# Patient Record
Sex: Female | Born: 2000 | Race: White | Hispanic: No | Marital: Single | Smoking: Never smoker
Health system: Southern US, Community
[De-identification: ages and names within clinical notes are randomized; demographics above are authoritative.]

## PROBLEM LIST (undated history)

## (undated) DIAGNOSIS — F419 Anxiety disorder, unspecified: Secondary | ICD-10-CM

## (undated) HISTORY — DX: Anxiety disorder, unspecified: F41.9

---

## 2019-02-08 ENCOUNTER — Other Ambulatory Visit: Payer: Self-pay

## 2019-02-08 DIAGNOSIS — Z20822 Contact with and (suspected) exposure to covid-19: Secondary | ICD-10-CM

## 2019-02-08 NOTE — Progress Notes (Unsigned)
lab

## 2019-02-09 LAB — NOVEL CORONAVIRUS, NAA: SARS-CoV-2, NAA: NOT DETECTED

## 2019-02-10 ENCOUNTER — Other Ambulatory Visit: Payer: Self-pay | Admitting: *Deleted

## 2019-02-10 DIAGNOSIS — Z20822 Contact with and (suspected) exposure to covid-19: Secondary | ICD-10-CM

## 2019-02-11 LAB — NOVEL CORONAVIRUS, NAA: SARS-CoV-2, NAA: NOT DETECTED

## 2019-02-14 ENCOUNTER — Other Ambulatory Visit: Payer: Self-pay

## 2019-02-14 DIAGNOSIS — Z20822 Contact with and (suspected) exposure to covid-19: Secondary | ICD-10-CM

## 2019-02-15 LAB — NOVEL CORONAVIRUS, NAA: SARS-CoV-2, NAA: NOT DETECTED

## 2019-02-24 ENCOUNTER — Other Ambulatory Visit: Payer: Self-pay

## 2019-02-24 DIAGNOSIS — Z20822 Contact with and (suspected) exposure to covid-19: Secondary | ICD-10-CM

## 2019-02-25 LAB — NOVEL CORONAVIRUS, NAA: SARS-CoV-2, NAA: NOT DETECTED

## 2019-03-14 ENCOUNTER — Other Ambulatory Visit: Payer: Self-pay

## 2019-03-14 DIAGNOSIS — Z20822 Contact with and (suspected) exposure to covid-19: Secondary | ICD-10-CM

## 2019-03-15 LAB — NOVEL CORONAVIRUS, NAA: SARS-CoV-2, NAA: NOT DETECTED

## 2019-04-05 ENCOUNTER — Other Ambulatory Visit: Payer: Self-pay

## 2019-04-05 DIAGNOSIS — Z20822 Contact with and (suspected) exposure to covid-19: Secondary | ICD-10-CM

## 2019-04-07 LAB — NOVEL CORONAVIRUS, NAA: SARS-CoV-2, NAA: DETECTED — AB

## 2020-01-22 ENCOUNTER — Emergency Department: Payer: 59

## 2020-01-22 ENCOUNTER — Other Ambulatory Visit: Payer: Self-pay

## 2020-01-22 DIAGNOSIS — R0602 Shortness of breath: Secondary | ICD-10-CM | POA: Diagnosis present

## 2020-01-22 DIAGNOSIS — Z5321 Procedure and treatment not carried out due to patient leaving prior to being seen by health care provider: Secondary | ICD-10-CM | POA: Diagnosis not present

## 2020-01-22 DIAGNOSIS — F410 Panic disorder [episodic paroxysmal anxiety] without agoraphobia: Secondary | ICD-10-CM | POA: Insufficient documentation

## 2020-01-22 NOTE — ED Triage Notes (Addendum)
Patient reports it is hard to breath at night while sleeping and she thinks she is having anxiety because of that and wants to make sure she is okay.  States she had started smoking again and feeling anxious that she has harmed herself.

## 2020-01-23 ENCOUNTER — Encounter: Payer: Self-pay | Admitting: Radiology

## 2020-01-23 ENCOUNTER — Other Ambulatory Visit: Payer: Self-pay

## 2020-01-23 ENCOUNTER — Emergency Department
Admission: EM | Admit: 2020-01-23 | Discharge: 2020-01-23 | Disposition: A | Discharge status: Home / Self Care | Payer: 59 | Attending: Emergency Medicine | Admitting: Emergency Medicine

## 2020-01-23 NOTE — ED Notes (Signed)
Pt states she does not want to stay for her results at this time. Will follow up with [facility]. No distress noted at this time.

## 2020-01-24 ENCOUNTER — Ambulatory Visit: Payer: 59 | Admitting: Hospice and Palliative Medicine

## 2020-01-24 ENCOUNTER — Other Ambulatory Visit: Payer: Self-pay

## 2020-01-24 ENCOUNTER — Encounter: Payer: Self-pay | Admitting: Hospice and Palliative Medicine

## 2020-01-24 DIAGNOSIS — R079 Chest pain, unspecified: Secondary | ICD-10-CM | POA: Diagnosis not present

## 2020-01-24 DIAGNOSIS — Z7689 Persons encountering health services in other specified circumstances: Secondary | ICD-10-CM

## 2020-01-24 DIAGNOSIS — F410 Panic disorder [episodic paroxysmal anxiety] without agoraphobia: Secondary | ICD-10-CM | POA: Diagnosis not present

## 2020-01-24 DIAGNOSIS — F411 Generalized anxiety disorder: Secondary | ICD-10-CM

## 2020-01-24 MED ORDER — ESCITALOPRAM OXALATE 10 MG PO TABS: 10.0000 mg | ORAL_TABLET | Freq: Every day | ORAL | 0 refills | Status: DC

## 2020-01-24 MED ORDER — LORAZEPAM 0.5 MG PO TABS: 0.5000 mg | ORAL_TABLET | Freq: Two times a day (BID) | ORAL | 0 refills | Status: AC | PRN

## 2020-01-24 NOTE — Progress Notes (Signed)
[facility] [address] [place], [place] [address]  Internal MEDICINE  Office Visit Note  Patient Name: Connie Rich  [id number]  [id number]  Date of Service: 01/24/2020   Complaints/HPI Pt is here for establishment of PCP. Chief Complaint  Patient presents with  . New Patient (Initial Visit)    past week since pt started school, pt has felt anxity, not sleeping well, doesnt feel like herself, tense, hyperventilating  . Quality Metric Gaps    HepV, HIV screening, TDAP   HPI  New patient today wanting to establish care with provider in [place]. 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 She was seen by her PCP in July of this year in [place]. Since the end of August she has been having episodes of overwhelming anxiety as well as panic attacks In the last week she says she has had 3-4 episodes of panic attacks. She describes theses episodes as periodic, not related to one certain thing. She has been in class, out to dinner or in her apartment when these episodes have happened. She says she gets this overwhelming sense of anxiety and stress and then goes into a panic state. During that time she says she feels chest pressure, heart racing and she hyperventilates. She had an episode 9/7 and her symptoms prompted her to be checked out in the emergency department. CXR negative at that time, no further diagnostics or treatments started. She denies any recent traumatic experience or change in her lifestyle, no recent alcohol or illicit drug use. No recent changes in her daily medications. She continues to sleep well at night and exercises daily.  Current Medication: Outpatient Encounter Medications as of 01/24/2020  Medication Sig  . levonorgestrel-ethinyl estradiol (LARISSIA) 0.1-20 MG-MCG tablet Take 1 tablet by mouth daily.  Marland Kitchen escitalopram (LEXAPRO) 10 MG tablet Take 1 tablet (10 mg total) by mouth daily.  Marland Kitchen LORazepam (ATIVAN)  0.5 MG tablet Take 1 tablet (0.5 mg total) by mouth 2 (two) times daily as needed for anxiety.   No facility-administered encounter medications on file as of 01/24/2020.    Surgical History: History reviewed. No pertinent surgical history.  Medical History: Past Medical History:  Diagnosis Date  . Anxiety     Family History: Family History  Problem Relation Age of Onset  . Crohn's disease Mother   . Anxiety disorder Mother   . Crohn's disease Maternal Aunt   . Anxiety disorder Maternal Aunt   . Anxiety disorder Maternal Uncle     Social History   Socioeconomic History  . Marital status: Single    Spouse name: Not on file  . Number of children: Not on file  . Years of education: Not on file  . Highest education level: Not on file  Occupational History  . Not on file  Tobacco Use  . Smoking status: Never Smoker  . Smokeless tobacco: Never Used  Substance and Sexual Activity  . Alcohol use: Not Currently    Comment: socially  . Drug use: Never  . Sexual activity: Not on file  Other Topics Concern  . Not on file  Social History Narrative  . Not on file   Social Determinants of Health   Financial Resource Strain:   . Difficulty of Paying Living Expenses: Not on file  Food Insecurity:   . Worried About Programme researcher, broadcasting/film/video in the Last Year: Not on file  . Ran Out of Food in the Last Year:  Not on file  Transportation Needs:   . Lack of Transportation (Medical): Not on file  . Lack of Transportation (Non-Medical): Not on file  Physical Activity:   . Days of Exercise per Week: Not on file  . Minutes of Exercise per Session: Not on file  Stress:   . Feeling of Stress : Not on file  Social Connections:   . Frequency of Communication with Friends and Family: Not on file  . Frequency of Social Gatherings with Friends and Family: Not on file  . Attends Religious Services: Not on file  . Active Member of Clubs or Organizations: Not on file  . Attends Tax inspector Meetings: Not on file  . Marital Status: Not on file  Intimate Partner Violence:   . Fear of Current or Ex-Partner: Not on file  . Emotionally Abused: Not on file  . Physically Abused: Not on file  . Sexually Abused: Not on file     Review of Systems  Constitutional: Negative for activity change, appetite change, chills, diaphoresis, fatigue and unexpected weight change.  HENT: Negative for ear pain, postnasal drip and sinus pressure.   Eyes: Negative for photophobia, discharge, redness, itching and visual disturbance.  Respiratory: Negative for cough, shortness of breath and wheezing.   Cardiovascular: Negative for chest pain, palpitations and leg swelling.       During panic attacks she feels her heart racing and chest pressure  Gastrointestinal: Negative for abdominal pain, constipation, diarrhea, nausea and vomiting.  Genitourinary: Negative for dysuria and flank pain.  Musculoskeletal: Negative for arthralgias, back pain, gait problem and neck pain.  Skin: Negative for color change.  Allergic/Immunologic: Negative for environmental allergies and food allergies.  Neurological: Negative for dizziness and headaches.  Hematological: Does not bruise/bleed easily.  Psychiatric/Behavioral: Negative for agitation, behavioral problems (depression) and hallucinations. The patient is nervous/anxious.        "panic attacks"    Vital Signs: BP 119/77   Pulse 92   Temp (!) 97.4 F (36.3 C)   Wt 169 lb (76.7 kg)   LMP 01/18/2020 (Exact Date)   SpO2 97%   BMI 25.89 kg/m    Physical Exam Constitutional:      Appearance: Normal appearance. She is normal weight.  HENT:     Nose: Nose normal.     Mouth/Throat:     Mouth: Mucous membranes are moist.  Cardiovascular:     Rate and Rhythm: Normal rate and regular rhythm.     Pulses: Normal pulses.     Heart sounds: Normal heart sounds.  Pulmonary:     Effort: Pulmonary effort is normal.     Breath sounds: Normal breath  sounds.  Abdominal:     General: Abdomen is flat.     Palpations: Abdomen is soft.  Musculoskeletal:        General: Normal range of motion.     Cervical back: Normal range of motion.  Skin:    General: Skin is warm.  Neurological:     General: No focal deficit present.     Mental Status: She is alert and oriented to person, place, and time. Mental status is at baseline.  Psychiatric:        Mood and Affect: Mood normal.        Behavior: Behavior normal.        Thought Content: Thought content normal.    Assessment/Plan: 1. Encounter to establish care with new doctor Requested that she have reports including any lab  testing she had done by her PCP in [place]. May need to order thyroid panel if it was not collected previously to review due to her symptoms.  2. Generalized anxiety disorder with panic attacks Will start low dose Lexapro, discussed it will take this medication 4-6 weeks to take full effect. In the meantime, 30 lorazepam 0.5 mg given for her to use as needed when she experiences a panic attack. Discussed that lorazepam should not be taken on a daily basis. Advised that she will also need to see out therapy or counseling to help with GAD management. Name of local therapist office provided. - LORazepam (ATIVAN) 0.5 MG tablet; Take 1 tablet (0.5 mg total) by mouth 2 (two) times daily as needed for anxiety.  Dispense: 30 tablet; Refill: 0 - escitalopram (LEXAPRO) 10 MG tablet; Take 1 tablet (10 mg total) by mouth daily.  Dispense: 90 tablet; Refill: 0  3. Chest pain, unspecified type CXR completed in ED 9/6, normal. EKG obtained today NSR, shortened PR interval at 104 msec. At this time symptoms do not appear to be cardiac in etiology. If symptoms persist after controlling her anxiety will need further cardiac work-up//possible holter monitor.( might want to look into MVP)  - EKG 12-Lead  General Counseling: [name] understanding of the findings of todays visit  and agrees with plan of treatment. I have discussed any further diagnostic evaluation that may be needed or ordered today. We also reviewed her medications today. she has been encouraged to call the office with any questions or concerns that should arise related to todays visit.  Reviewed risks and possible side effects associated with taking opiates, benzodiazepines and other CNS depressants. Combination of these could cause dizziness and drowsiness. Advised patient not to drive or operate machinery when taking these medications, as patient's and other's life can be at risk and will have consequences. Patient verbalized understanding in this matter. Dependence and abuse for these drugs will be monitored closely. A Controlled substance policy and procedure is on file which allows [facility] to order a urine drug screen test at any visit. Patient understands and agrees with the plan  Orders Placed This Encounter  Procedures  . EKG 12-Lead    Meds ordered this encounter  Medications  . LORazepam (ATIVAN) 0.5 MG tablet    Sig: Take 1 tablet (0.5 mg total) by mouth 2 (two) times daily as needed for anxiety.    Dispense:  30 tablet    Refill:  0  . escitalopram (LEXAPRO) 10 MG tablet    Sig: Take 1 tablet (10 mg total) by mouth daily.    Dispense:  90 tablet    Refill:  0    Time spent: 35 Minutes Time spent includes review of chart, medications, test results and follow-up plan with the patient.  This patient was seen by Blima Ledger AGNP-C in Collaboration with Dr Lyndon Code as a part of collaborative care agreement  [name]. [facility]

## 2020-02-08 ENCOUNTER — Ambulatory Visit: Payer: 59 | Admitting: Hospice and Palliative Medicine

## 2020-03-06 ENCOUNTER — Other Ambulatory Visit: Payer: Self-pay

## 2020-03-06 ENCOUNTER — Encounter: Payer: Self-pay | Admitting: Hospice and Palliative Medicine

## 2020-03-06 ENCOUNTER — Ambulatory Visit: Payer: 59 | Admitting: Hospice and Palliative Medicine

## 2020-03-06 DIAGNOSIS — F410 Panic disorder [episodic paroxysmal anxiety] without agoraphobia: Secondary | ICD-10-CM | POA: Diagnosis not present

## 2020-03-06 DIAGNOSIS — Z23 Encounter for immunization: Secondary | ICD-10-CM

## 2020-03-06 DIAGNOSIS — F411 Generalized anxiety disorder: Secondary | ICD-10-CM

## 2020-03-06 DIAGNOSIS — R079 Chest pain, unspecified: Secondary | ICD-10-CM

## 2020-03-06 MED ORDER — ESCITALOPRAM OXALATE 10 MG PO TABS: 10.0000 mg | ORAL_TABLET | Freq: Every day | ORAL | 0 refills | Status: DC

## 2020-03-06 NOTE — Progress Notes (Signed)
[facility] [address] [place], [place] [address]  Internal MEDICINE  Office Visit Note  Patient Name: Connie Rich  [id number]  [id number]  Date of Service: 03/08/2020  Chief Complaint  Patient presents with  . Follow-up  . Anxiety  . policy update form    received    HPI Patient is here for routine follow-up Started on Lexapro at last visit for anxiety with panic attacks--she is feeling much better since starting medication, she has only had 2 panic attacks since starting and feels she is much more relaxed She says she is able to focus more on her school work and is sleeping much better as well She has had no further chest pain--her one panic attack was much more mild compared to the attacks she was having before starting Lexapro, she was able to take one dose of her Lorazepam during that panic attack and it resolved  Discussed that we have still not yet received copy of her lab work that was done in July by her PCP in [place]-would like to review for potential underlying causes of anxiety  Current Medication: Outpatient Encounter Medications as of 03/06/2020  Medication Sig  . escitalopram (LEXAPRO) 10 MG tablet Take 1 tablet (10 mg total) by mouth daily.  Marland Kitchen levonorgestrel-ethinyl estradiol (LARISSIA) 0.1-20 MG-MCG tablet Take 1 tablet by mouth daily.  Marland Kitchen LORazepam (ATIVAN) 0.5 MG tablet Take 1 tablet (0.5 mg total) by mouth 2 (two) times daily as needed for anxiety.  . [DISCONTINUED] escitalopram (LEXAPRO) 10 MG tablet Take 1 tablet (10 mg total) by mouth daily.   No facility-administered encounter medications on file as of 03/06/2020.    Surgical History: History reviewed. No pertinent surgical history.  Medical History: Past Medical History:  Diagnosis Date  . Anxiety     Family History: Family History  Problem Relation Age of Onset  . Crohn's disease Mother   . Anxiety disorder Mother   . Crohn's disease Maternal Aunt   . Anxiety disorder  Maternal Aunt   . Anxiety disorder Maternal Uncle     Social History   Socioeconomic History  . Marital status: Single    Spouse name: Not on file  . Number of children: Not on file  . Years of education: Not on file  . Highest education level: Not on file  Occupational History  . Not on file  Tobacco Use  . Smoking status: Never Smoker  . Smokeless tobacco: Never Used  Substance and Sexual Activity  . Alcohol use: Not Currently    Comment: socially  . Drug use: Never  . Sexual activity: Not on file  Other Topics Concern  . Not on file  Social History Narrative  . Not on file   Social Determinants of Health   Financial Resource Strain:   . Difficulty of Paying Living Expenses: Not on file  Food Insecurity:   . Worried About Programme researcher, broadcasting/film/video in the Last Year: Not on file  . Ran Out of Food in the Last Year: Not on file  Transportation Needs:   . Lack of Transportation (Medical): Not on file  . Lack of Transportation (Non-Medical): Not on file  Physical Activity:   . Days of Exercise per Week: Not on file  . Minutes of Exercise per Session: Not on file  Stress:   . Feeling of Stress : Not on file  Social Connections:   . Frequency of Communication with Friends and Family: Not on file  .  Frequency of Social Gatherings with Friends and Family: Not on file  . Attends Religious Services: Not on file  . Active Member of Clubs or Organizations: Not on file  . Attends Banker Meetings: Not on file  . Marital Status: Not on file  Intimate Partner Violence:   . Fear of Current or Ex-Partner: Not on file  . Emotionally Abused: Not on file  . Physically Abused: Not on file  . Sexually Abused: Not on file      Review of Systems  Constitutional: Negative for chills, diaphoresis and fatigue.  HENT: Negative for ear pain, postnasal drip and sinus pressure.   Eyes: Negative for photophobia, discharge, redness, itching and visual disturbance.  Respiratory:  Negative for cough, shortness of breath and wheezing.   Cardiovascular: Negative for chest pain, palpitations and leg swelling.  Gastrointestinal: Negative for abdominal pain, constipation, diarrhea, nausea and vomiting.  Genitourinary: Negative for dysuria and flank pain.  Musculoskeletal: Negative for arthralgias, back pain, gait problem and neck pain.  Skin: Negative for color change.  Allergic/Immunologic: Negative for environmental allergies and food allergies.  Neurological: Negative for dizziness and headaches.  Hematological: Does not bruise/bleed easily.  Psychiatric/Behavioral: Negative for agitation, behavioral problems (depression) and hallucinations.    Vital Signs: BP 100/62   Pulse 79   Temp 97.8 F (36.6 C)   Resp 16   Ht 5\' 8"  (1.727 m)   Wt 173 lb 3.2 oz (78.6 kg)   SpO2 99%   BMI 26.33 kg/m    Physical Exam Vitals reviewed.  Constitutional:      Appearance: Normal appearance. She is normal weight.  Cardiovascular:     Rate and Rhythm: Normal rate and regular rhythm.     Pulses: Normal pulses.     Heart sounds: Normal heart sounds.  Pulmonary:     Effort: Pulmonary effort is normal.     Breath sounds: Normal breath sounds.  Musculoskeletal:        General: Normal range of motion.  Skin:    General: Skin is warm.  Neurological:     General: No focal deficit present.     Mental Status: She is alert and oriented to person, place, and time. Mental status is at baseline.  Psychiatric:        Mood and Affect: Mood normal.        Behavior: Behavior normal.        Thought Content: Thought content normal.    Assessment/Plan: 1. Chest pain, unspecified type Resolved--no further episodes of chest pain since starting Lexapro  2. Generalized anxiety disorder with panic attacks Anxiety and panic attacks well controlled on Lexapro--requesting refills today, will continue to monitor Will need labs from previous provider for review--will order labs to be drawn  at next visit - escitalopram (LEXAPRO) 10 MG tablet; Take 1 tablet (10 mg total) by mouth daily.  Dispense: 90 tablet; Refill: 0  3. Flu vaccine need - Flu Vaccine MDCK QUAD PF  General Counseling: Kristyne verbalizes understanding of the findings of todays visit and agrees with plan of treatment. I have discussed any further diagnostic evaluation that may be needed or ordered today. We also reviewed her medications today. she has been encouraged to call the office with any questions or concerns that should arise related to todays visit.    Orders Placed This Encounter  Procedures  . Flu Vaccine MDCK QUAD PF    Meds ordered this encounter  Medications  . escitalopram (LEXAPRO) 10 MG  tablet    Sig: Take 1 tablet (10 mg total) by mouth daily.    Dispense:  90 tablet    Refill:  0    Time spent: 25 Minutes Time spent includes review of chart, medications, test results and follow-up plan with the patient.  This patient was seen by Leeanne Deed AGNP-C in Collaboration with Dr Lyndon Code as a part of collaborative care agreement     [name]. Shalynn Jorstad AGNP-C Internal medicine

## 2020-03-08 ENCOUNTER — Encounter: Payer: Self-pay | Admitting: Hospice and Palliative Medicine

## 2020-03-08 NOTE — Addendum Note (Signed)
Addended by: Lyndon Code on: 03/08/2020 09:34 AM   Modules accepted: Level of Service

## 2020-06-21 ENCOUNTER — Other Ambulatory Visit: Payer: Self-pay | Admitting: Hospice and Palliative Medicine

## 2020-06-21 DIAGNOSIS — F410 Panic disorder [episodic paroxysmal anxiety] without agoraphobia: Secondary | ICD-10-CM

## 2020-07-10 ENCOUNTER — Other Ambulatory Visit: Payer: Self-pay

## 2020-07-10 ENCOUNTER — Ambulatory Visit: Payer: 59 | Admitting: Hospice and Palliative Medicine

## 2020-07-10 ENCOUNTER — Encounter: Payer: Self-pay | Admitting: Hospice and Palliative Medicine

## 2020-07-10 VITALS — BP 116/70 | HR 97 | Temp 97.5°F | Resp 16 | Ht 68.0 in | Wt 166.0 lb

## 2020-07-10 DIAGNOSIS — F411 Generalized anxiety disorder: Secondary | ICD-10-CM

## 2020-07-10 DIAGNOSIS — F410 Panic disorder [episodic paroxysmal anxiety] without agoraphobia: Secondary | ICD-10-CM

## 2020-07-10 NOTE — Progress Notes (Signed)
[facility] [address] [place], [place] [address]  Internal MEDICINE  Office Visit Note  Patient Name: Connie Rich  [id number]  [id number]   Date of Service: 07/10/2020  Chief Complaint  Patient presents with  . 4 month follow up  . Anxiety    HPI Patient is here for routine follow-up Things have been going well, this semester is keeping her busy Continues to take Lexapro for her anxiety--symptoms are well controlled Rarely needs lorazepam for overwhelming anxiety or insomnia--takes as needed maybe once monthly Plans to travel to [place] this summer once the semester is over Will have annual physical exam with her PCP back home this summer  Current Medication: Outpatient Encounter Medications as of 07/10/2020  Medication Sig  . escitalopram (LEXAPRO) 10 MG tablet TAKE 1 TABLET BY MOUTH EVERY DAY  . levonorgestrel-ethinyl estradiol (LARISSIA) 0.1-20 MG-MCG tablet Take 1 tablet by mouth daily.  Marland Kitchen LORazepam (ATIVAN) 0.5 MG tablet Take 1 tablet (0.5 mg total) by mouth 2 (two) times daily as needed for anxiety.   No facility-administered encounter medications on file as of 07/10/2020.    Surgical History: History reviewed. No pertinent surgical history.  Medical History: Past Medical History:  Diagnosis Date  . Anxiety     Family History: Family History  Problem Relation Age of Onset  . Crohn's disease Mother   . Anxiety disorder Mother   . Crohn's disease Maternal Aunt   . Anxiety disorder Maternal Aunt   . Anxiety disorder Maternal Uncle     Social History   Socioeconomic History  . Marital status: Single    Spouse name: Not on file  . Number of children: Not on file  . Years of education: Not on file  . Highest education level: Not on file  Occupational History  . Not on file  Tobacco Use  . Smoking status: Never Smoker  . Smokeless tobacco: Never Used  Substance and Sexual Activity  . Alcohol use: Not Currently    Comment:  socially  . Drug use: Never  . Sexual activity: Not on file  Other Topics Concern  . Not on file  Social History Narrative  . Not on file   Social Determinants of Health   Financial Resource Strain: Not on file  Food Insecurity: Not on file  Transportation Needs: Not on file  Physical Activity: Not on file  Stress: Not on file  Social Connections: Not on file  Intimate Partner Violence: Not on file      Review of Systems  Constitutional: Negative for chills, diaphoresis and fatigue.  HENT: Negative for ear pain, postnasal drip and sinus pressure.   Eyes: Negative for photophobia, discharge, redness, itching and visual disturbance.  Respiratory: Negative for cough, shortness of breath and wheezing.   Cardiovascular: Negative for chest pain, palpitations and leg swelling.  Gastrointestinal: Negative for abdominal pain, constipation, diarrhea, nausea and vomiting.  Genitourinary: Negative for dysuria and flank pain.  Musculoskeletal: Negative for arthralgias, back pain, gait problem and neck pain.  Skin: Negative for color change.  Allergic/Immunologic: Negative for environmental allergies and food allergies.  Neurological: Negative for dizziness and headaches.  Hematological: Does not bruise/bleed easily.  Psychiatric/Behavioral: Negative for agitation, behavioral problems (depression) and hallucinations.    Vital Signs: BP 116/70   Pulse 97   Temp (!) 97.5 F (36.4 C)   Resp 16   Ht 5\' 8"  (1.727 m)   Wt 166 lb (75.3 kg)   SpO2 98%   BMI  25.24 kg/m    Physical Exam Vitals reviewed.  Constitutional:      Appearance: Normal appearance. She is normal weight.  Cardiovascular:     Rate and Rhythm: Normal rate and regular rhythm.     Pulses: Normal pulses.     Heart sounds: Normal heart sounds.  Pulmonary:     Effort: Pulmonary effort is normal.     Breath sounds: Normal breath sounds.  Abdominal:     General: Abdomen is flat.     Palpations: Abdomen is soft.   Musculoskeletal:        General: Normal range of motion.     Cervical back: Normal range of motion.  Skin:    General: Skin is warm.  Neurological:     General: No focal deficit present.     Mental Status: She is alert and oriented to person, place, and time. Mental status is at baseline.  Psychiatric:        Mood and Affect: Mood normal.        Behavior: Behavior normal.        Thought Content: Thought content normal.        Judgment: Judgment normal.    Assessment/Plan: 1. Generalized anxiety disorder with panic attacks Well controlled on Lexapro Lorazepam as needed Continue to monitor  General Counseling: [name] understanding of the findings of todays visit and agrees with plan of treatment. I have discussed any further diagnostic evaluation that may be needed or ordered today. We also reviewed her medications today. she has been encouraged to call the office with any questions or concerns that should arise related to todays visit.    Time spent: 25 Minutes   This patient was seen by Leeanne Deed AGNP-C in Collaboration with Dr Lyndon Code as a part of collaborative care agreement     [name]. Philana Younis AGNP-C Internal medicine

## 2020-09-20 ENCOUNTER — Ambulatory Visit: Payer: 59 | Admitting: Hospice and Palliative Medicine

## 2020-09-20 ENCOUNTER — Other Ambulatory Visit: Payer: Self-pay

## 2020-09-20 ENCOUNTER — Encounter: Payer: Self-pay | Admitting: Hospice and Palliative Medicine

## 2020-09-20 VITALS — BP 100/64 | HR 89 | Temp 97.4°F | Resp 16 | Ht 68.0 in | Wt 177.6 lb

## 2020-09-20 DIAGNOSIS — G479 Sleep disorder, unspecified: Secondary | ICD-10-CM

## 2020-09-20 DIAGNOSIS — F410 Panic disorder [episodic paroxysmal anxiety] without agoraphobia: Secondary | ICD-10-CM

## 2020-09-20 DIAGNOSIS — F411 Generalized anxiety disorder: Secondary | ICD-10-CM

## 2020-09-20 MED ORDER — ESCITALOPRAM OXALATE 10 MG PO TABS: 1.0000 | ORAL_TABLET | Freq: Every day | ORAL | 1 refills | Status: DC

## 2020-09-20 NOTE — Progress Notes (Signed)
[facility] [address] [place], [place] [address]  Internal MEDICINE  Office Visit Note  Patient Name: Connie Rich  [id number]  [id number]  Date of Service: 09/20/2020  Chief Complaint  Patient presents with  . Follow-up    Refill request  . 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 levonorgestrel-ethinyl estradiol (ALESSE) 0.1-20 MG-MCG tablet Take 1 tablet by mouth daily.  Marland Kitchen LORazepam (ATIVAN) 0.5 MG tablet Take 1 tablet (0.5 mg total) by mouth 2 (two) times daily as needed for anxiety.  . [DISCONTINUED] escitalopram (LEXAPRO) 10 MG tablet TAKE 1 TABLET BY MOUTH EVERY DAY  . escitalopram (LEXAPRO) 10 MG tablet Take 1 tablet (10 mg total) by mouth daily.   No facility-administered encounter medications on file as of 09/20/2020.    Surgical History: History reviewed. No pertinent surgical history.  Medical History: Past Medical History:  Diagnosis Date  . Anxiety     Family History: Family History  Problem Relation Age of Onset  . Crohn's disease Mother   . Anxiety disorder Mother   . Crohn's disease Maternal Aunt   . Anxiety disorder Maternal Aunt   . Anxiety disorder Maternal Uncle     Social History   Socioeconomic History  . Marital status: Single    Spouse name: Not on file  . Number of children: Not on file  . Years of education: Not on file  . Highest education  level: Not on file  Occupational History  . Not on file  Tobacco Use  . Smoking status: Never Smoker  . Smokeless tobacco: Never Used  Substance and Sexual Activity  . Alcohol use: Not Currently    Comment: socially  . Drug use: Never  . Sexual activity: Not on file  Other Topics Concern  . Not on file  Social History Narrative  . Not on file   Social Determinants of Health   Financial Resource Strain: Not on file  Food Insecurity: Not on file  Transportation Needs: Not on file  Physical Activity: Not on file  Stress: Not on file  Social Connections: Not on file  Intimate Partner Violence: Not on file      Review of Systems  Constitutional: Negative for chills, diaphoresis and fatigue.  HENT: Negative for ear pain, postnasal drip and sinus pressure.   Eyes: Negative for photophobia, discharge, redness, itching and visual disturbance.  Respiratory: Negative for cough, shortness of breath and wheezing.   Cardiovascular: Negative for chest pain, palpitations and leg swelling.  Gastrointestinal: Negative for abdominal pain, constipation, diarrhea, nausea and vomiting.  Genitourinary: Negative for dysuria and flank pain.  Musculoskeletal: Negative for arthralgias, back pain, gait problem and neck pain.  Skin: Negative for color change.  Allergic/Immunologic: Negative for environmental allergies and food allergies.  Neurological: Negative for dizziness and headaches.  Hematological: Does not bruise/bleed easily.  Psychiatric/Behavioral: Negative for agitation, behavioral problems (depression) and hallucinations.  Vital Signs: BP 100/64   Pulse 89   Temp (!) 97.4 F (36.3 C)   Resp 16   Ht 5\' 8"  (1.727 m)   Wt 177 lb 9.6 oz (80.6 kg)   SpO2 99%   BMI 27.00 kg/m    Physical Exam Vitals reviewed.  Constitutional:      Appearance: Normal appearance. She is normal weight.  Cardiovascular:     Rate and Rhythm: Normal rate and regular rhythm.     Pulses: Normal  pulses.     Heart sounds: Normal heart sounds.  Pulmonary:     Effort: Pulmonary effort is normal.     Breath sounds: Normal breath sounds.  Abdominal:     General: Abdomen is flat.     Palpations: Abdomen is soft.  Musculoskeletal:        General: Normal range of motion.     Cervical back: Normal range of motion.  Skin:    General: Skin is warm.  Neurological:     General: No focal deficit present.     Mental Status: She is alert and oriented to person, place, and time. Mental status is at baseline.  Psychiatric:        Mood and Affect: Mood normal.        Behavior: Behavior normal.        Thought Content: Thought content normal.        Judgment: Judgment normal.    Assessment/Plan: 1. Sleep disturbance Encouraged to try Melatonin before bedtime to help with completion of each sleep phase to avoid awakening during dreaming state  2. Generalized anxiety disorder with panic attacks Continue with current dosing of Lexapro as symptoms remain well controlled, requesting refills - escitalopram (LEXAPRO) 10 MG tablet; Take 1 tablet (10 mg total) by mouth daily.  Dispense: 90 tablet; Refill: 1  General Counseling: understanding of the findings of todays visit and agrees with plan of treatment. I have discussed any further diagnostic evaluation that may be needed or ordered today. We also reviewed her medications today. she has been encouraged to call the office with any questions or concerns that should arise related to todays visit.   Meds ordered this encounter  Medications  . escitalopram (LEXAPRO) 10 MG tablet    Sig: Take 1 tablet (10 mg total) by mouth daily.    Dispense:  90 tablet    Refill:  1    Time spent: 25 Minutes Time spent includes review of chart, medications, test results and follow-up plan with the patient.  This patient was seen by [name] AGNP-C in Collaboration with Dr Leeanne Deed as a part of collaborative care agreement      Lyndon Code. Teancum Brule AGNP-C Internal medicine

## 2021-03-25 ENCOUNTER — Ambulatory Visit: Payer: 59 | Admitting: Nurse Practitioner

## 2021-03-25 ENCOUNTER — Other Ambulatory Visit: Payer: Self-pay

## 2021-03-25 DIAGNOSIS — F411 Generalized anxiety disorder: Secondary | ICD-10-CM

## 2021-03-25 DIAGNOSIS — F410 Panic disorder [episodic paroxysmal anxiety] without agoraphobia: Secondary | ICD-10-CM

## 2021-03-25 MED ORDER — ESCITALOPRAM OXALATE 10 MG PO TABS: 10.0000 mg | ORAL_TABLET | Freq: Every day | ORAL | 0 refills | Status: DC

## 2021-03-26 ENCOUNTER — Ambulatory Visit: Payer: 59 | Admitting: Nurse Practitioner

## 2021-04-15 ENCOUNTER — Encounter: Payer: Self-pay | Admitting: Nurse Practitioner

## 2021-04-15 ENCOUNTER — Ambulatory Visit: Payer: 59 | Admitting: Nurse Practitioner

## 2021-04-15 ENCOUNTER — Other Ambulatory Visit: Payer: Self-pay

## 2021-04-15 VITALS — BP 119/61 | HR 73 | Temp 98.4°F | Resp 16 | Ht 68.5 in | Wt 182.2 lb

## 2021-04-15 DIAGNOSIS — F411 Generalized anxiety disorder: Secondary | ICD-10-CM | POA: Diagnosis not present

## 2021-04-15 DIAGNOSIS — F410 Panic disorder [episodic paroxysmal anxiety] without agoraphobia: Secondary | ICD-10-CM

## 2021-04-15 DIAGNOSIS — L2389 Allergic contact dermatitis due to other agents: Secondary | ICD-10-CM | POA: Diagnosis not present

## 2021-04-15 MED ORDER — DESONIDE 0.05 % EX OINT: 1.0000 "application " | TOPICAL_OINTMENT | Freq: Two times a day (BID) | CUTANEOUS | 1 refills | Status: AC

## 2021-04-15 NOTE — Progress Notes (Signed)
[facility] [place], [place] [address]  Internal MEDICINE  Office Visit Note  Patient Name: Connie Rich  [id number]  [id number]  Date of Service: 04/15/2021  Chief Complaint  Patient presents with   Follow-up    Rash on right under arm, noticed it off and on over the last month, itchy, burning, red, has not used any new products   Anxiety    HPI Nytia presents for a follow up visit for routine follow up. She just picked up a 3 month supply of lexapro. Also has a Rash under right underarm, itchy, uses dove antiperspirant, has not changed any products including laundry detergent or bodywash.    Current Medication: Outpatient Encounter Medications as of 04/15/2021  Medication Sig   desonide (DESOWEN) 0.05 % ointment Apply 1 application topically 2 (two) times daily.   escitalopram (LEXAPRO) 10 MG tablet Take 1 tablet (10 mg total) by mouth daily.   LORazepam (ATIVAN) 0.5 MG tablet Take 1 tablet (0.5 mg total) by mouth 2 (two) times daily as needed for anxiety.   [DISCONTINUED] levonorgestrel-ethinyl estradiol (ALESSE) 0.1-20 MG-MCG tablet Take 1 tablet by mouth daily.   levonorgestrel-ethinyl estradiol (LARISSIA) 0.1-20 MG-MCG tablet Take by mouth.   No facility-administered encounter medications on file as of 04/15/2021.    Surgical History: History reviewed. No pertinent surgical history.  Medical History: Past Medical History:  Diagnosis Date   Anxiety     Family History: Family History  Problem Relation Age of Onset   Crohn's disease Mother    Anxiety disorder Mother    Crohn's disease Maternal Aunt    Anxiety disorder Maternal Aunt    Anxiety disorder Maternal Uncle     Social History   Socioeconomic History   Marital status: Single    Spouse name: Not on file   Number of children: Not on file   Years of education: Not on file   Highest education level: Not on file  Occupational History   Not on file  Tobacco Use    Smoking status: Never   Smokeless tobacco: Never  Substance and Sexual Activity   Alcohol use: Yes    Comment: socially   Drug use: Never   Sexual activity: Not on file  Other Topics Concern   Not on file  Social History Narrative   Not on file   Social Determinants of Health   Financial Resource Strain: Not on file  Food Insecurity: Not on file  Transportation Needs: Not on file  Physical Activity: Not on file  Stress: Not on file  Social Connections: Not on file  Intimate Partner Violence: Not on file      Review of Systems  Constitutional:  Negative for chills, fatigue and unexpected weight change.  HENT:  Negative for congestion, rhinorrhea, sneezing and sore throat.   Eyes:  Negative for redness.  Respiratory:  Negative for cough, chest tightness and shortness of breath.   Cardiovascular:  Negative for chest pain and palpitations.  Gastrointestinal:  Negative for abdominal pain, constipation, diarrhea, nausea and vomiting.  Genitourinary:  Negative for dysuria and frequency.  Musculoskeletal:  Negative for arthralgias, back pain, joint swelling and neck pain.  Skin:  Positive for rash (right axillary area).  Neurological: Negative.  Negative for tremors and numbness.  Hematological:  Negative for adenopathy. Does not bruise/bleed easily.  Psychiatric/Behavioral:  Negative for behavioral problems (Depression), sleep disturbance and suicidal ideas. The patient is not nervous/anxious.    Vital Signs: BP 119/61  Pulse 73   Temp 98.4 F (36.9 C)   Resp 16   Ht 5' 8.5" (1.74 m)   Wt 182 lb 3.2 oz (82.6 kg)   SpO2 98%   BMI 27.30 kg/m    Physical Exam Vitals reviewed.  Constitutional:      General: She is not in acute distress.    Appearance: Normal appearance. She is not ill-appearing.  HENT:     Head: Normocephalic and atraumatic.  Eyes:     Pupils: Pupils are equal, round, and reactive to light.  Cardiovascular:     Rate and Rhythm: Normal rate and  regular rhythm.  Pulmonary:     Effort: Pulmonary effort is normal. No respiratory distress.  Neurological:     Mental Status: She is alert and oriented to person, place, and time.     Cranial Nerves: No cranial nerve deficit.     Coordination: Coordination normal.     Gait: Gait normal.  Psychiatric:        Mood and Affect: Mood normal.        Behavior: Behavior normal.       Assessment/Plan: 1. Allergic contact dermatitis due to other agents Desonide prescribed, apply twice daily until resolved. Instructed patient to stop shaving the area until the rash resolves due to increased irritation. Avoid applying deodorant directly to the area until the rash resolves.  - desonide (DESOWEN) 0.05 % ointment; Apply 1 application topically 2 (two) times daily.  Dispense: 30 g; Refill: 1  2. Generalized anxiety disorder with panic attacks Taking lexapro, no refills needed for now.    General Counseling: [name] understanding of the findings of todays visit and agrees with plan of treatment. I have discussed any further diagnostic evaluation that may be needed or ordered today. We also reviewed her medications today. she has been encouraged to call the office with any questions or concerns that should arise related to todays visit.    No orders of the defined types were placed in this encounter.   Meds ordered this encounter  Medications   desonide (DESOWEN) 0.05 % ointment    Sig: Apply 1 application topically 2 (two) times daily.    Dispense:  30 g    Refill:  1    Return if symptoms worsen or fail to improve.   Total time spent:20 Minutes Time spent includes review of chart, medications, test results, and follow up plan with the patient.   [place] Controlled Substance Database was reviewed by me.  This patient was seen by Sallyanne Kuster, FNP-C in collaboration with Dr. Beverely Risen as a part of collaborative care agreement.   Suede Greenawalt R. Tedd Sias, MSN, FNP-C Internal  medicine

## 2021-05-09 ENCOUNTER — Encounter: Payer: Self-pay | Admitting: Internal Medicine

## 2021-06-20 ENCOUNTER — Other Ambulatory Visit: Payer: Self-pay | Admitting: Internal Medicine

## 2021-06-20 DIAGNOSIS — F410 Panic disorder [episodic paroxysmal anxiety] without agoraphobia: Secondary | ICD-10-CM

## 2021-12-22 ENCOUNTER — Other Ambulatory Visit: Payer: Self-pay | Admitting: Internal Medicine

## 2021-12-22 DIAGNOSIS — F410 Panic disorder [episodic paroxysmal anxiety] without agoraphobia: Secondary | ICD-10-CM

## 2022-03-04 IMAGING — CR DG CHEST 2V
2 series · 2 of 2 positions shown · non-contrast
Comparison: None.

CLINICAL DATA: Shortness of breath

EXAM:
CHEST - 2 VIEW

[chest pa]
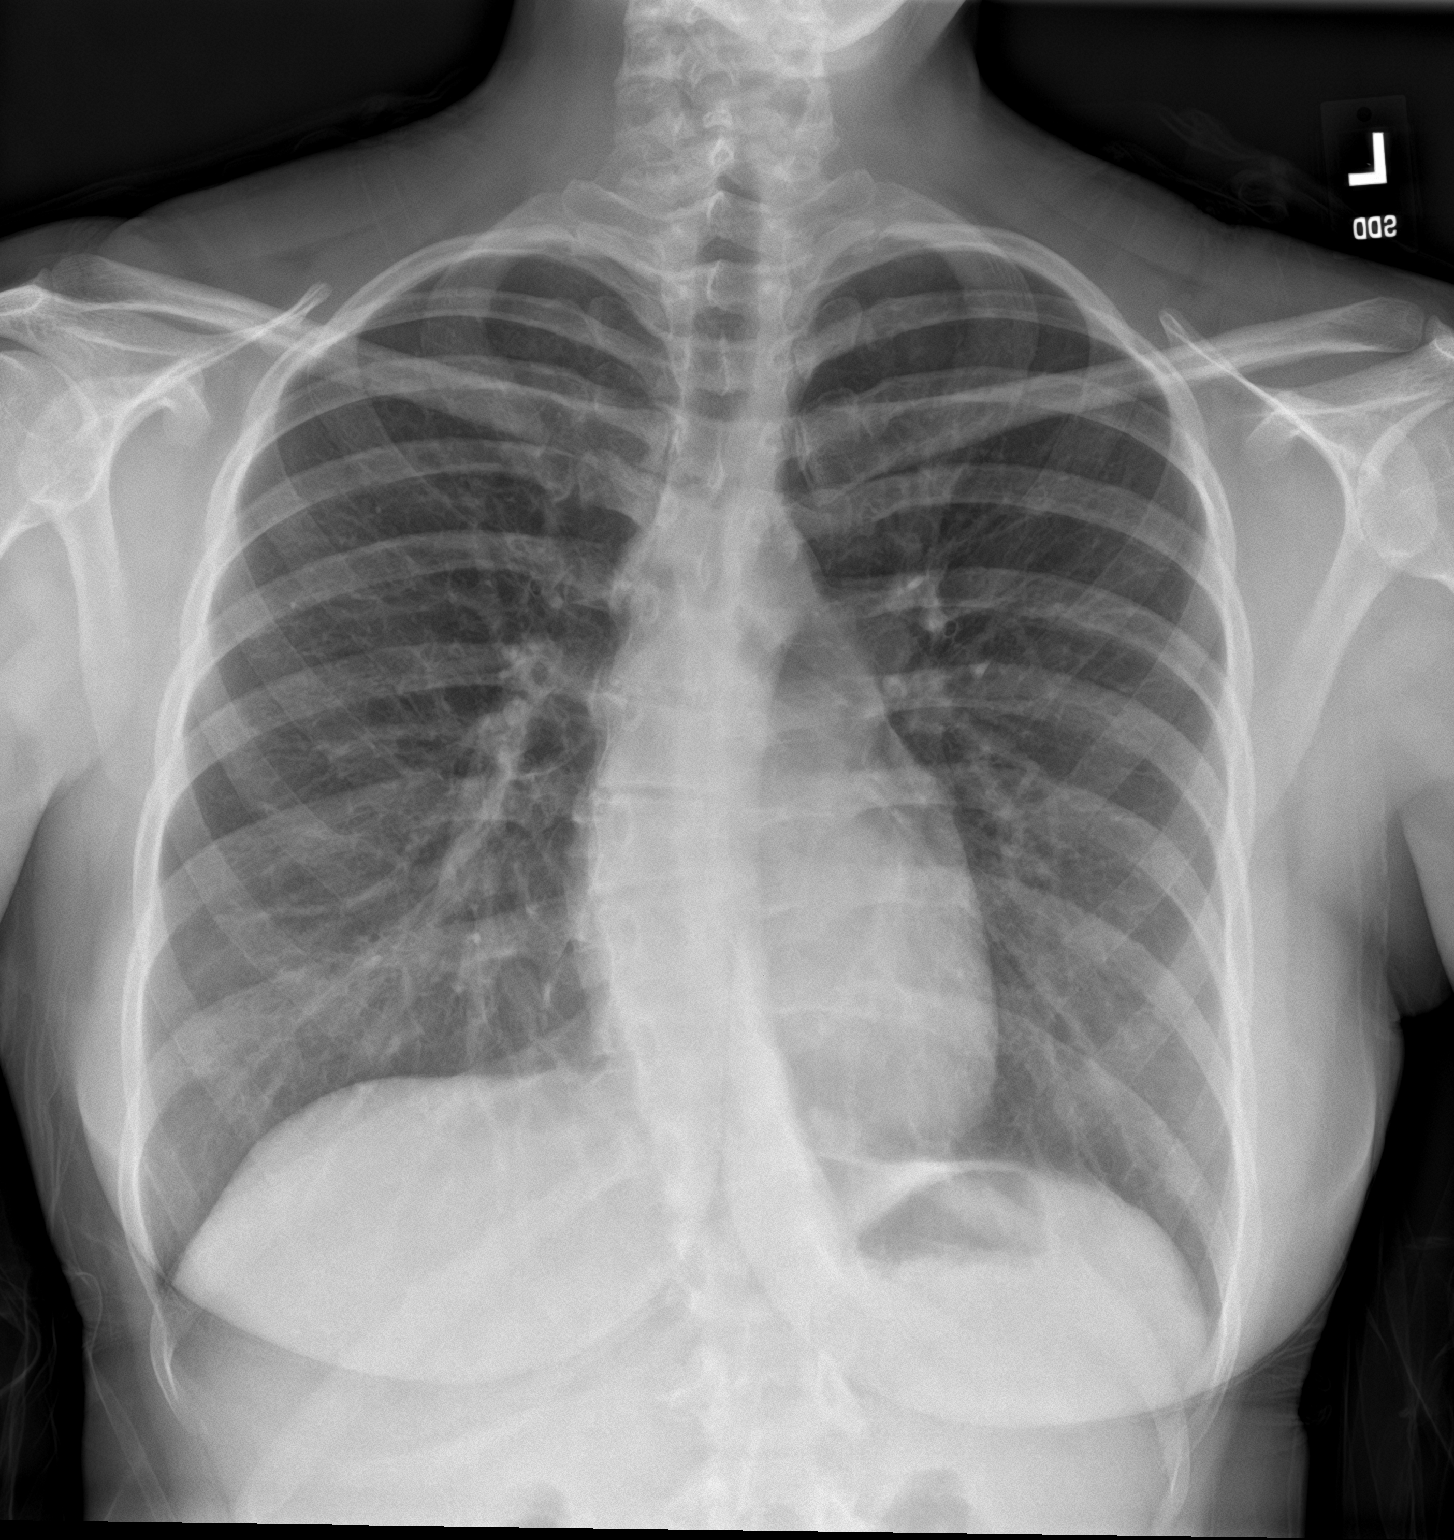

[chest lat]
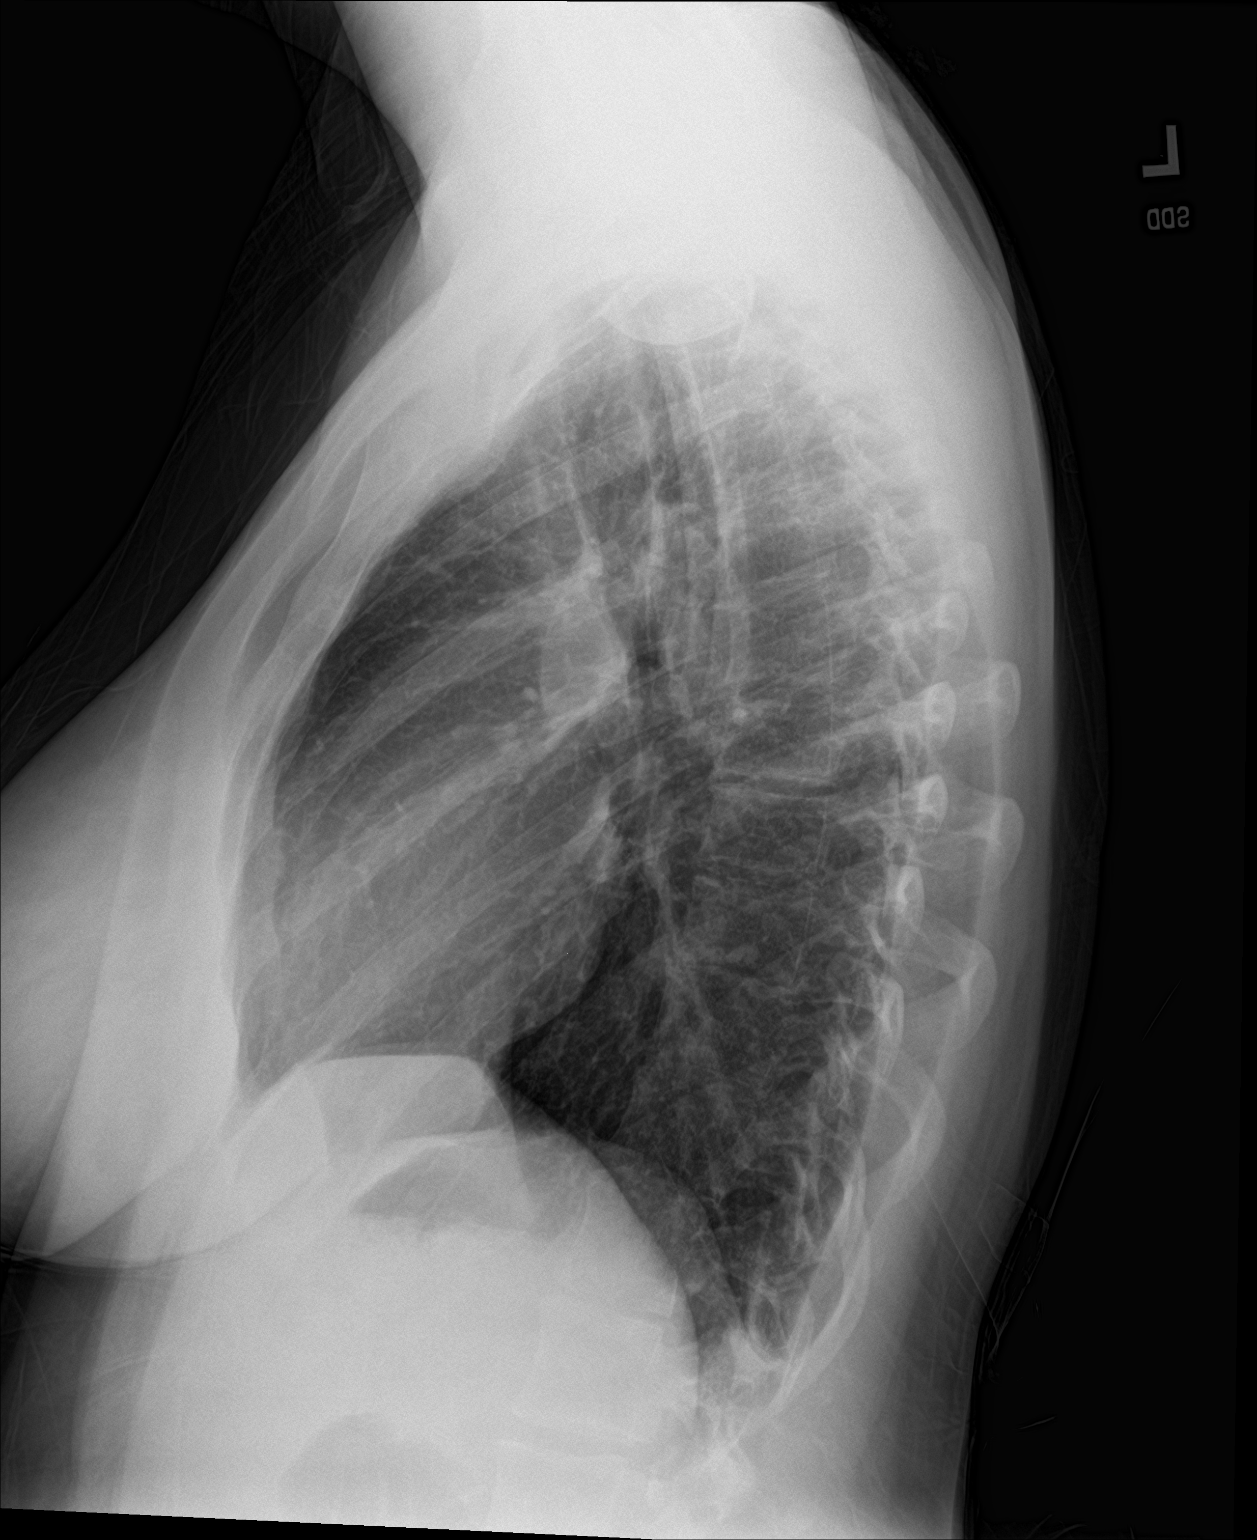

[2 of 2 positions shown; findings below may reference images not displayed]

FINDINGS: Heart and mediastinal contours are within normal limits. No focal
opacities or effusions. No acute bony abnormality.
IMPRESSION: No active cardiopulmonary disease.

## 2022-07-03 ENCOUNTER — Other Ambulatory Visit: Payer: Self-pay | Admitting: Internal Medicine

## 2022-07-03 DIAGNOSIS — F410 Panic disorder [episodic paroxysmal anxiety] without agoraphobia: Secondary | ICD-10-CM
